# Patient Record
Sex: Male | Born: 1985 | Race: White | Hispanic: No | Marital: Married
Health system: Southern US, Community
[De-identification: ages and names within clinical notes are randomized; demographics above are authoritative.]

---

## 2010-04-24 ENCOUNTER — Emergency Department (HOSPITAL_COMMUNITY)
Admission: EM | Admit: 2010-04-24 | Discharge: 2010-04-24 | Payer: Self-pay | Source: Home / Self Care | Admitting: Emergency Medicine

## 2010-04-30 ENCOUNTER — Ambulatory Visit (HOSPITAL_COMMUNITY)
Admission: RE | Admit: 2010-04-30 | Discharge: 2010-04-30 | Payer: Self-pay | Source: Home / Self Care | Admitting: Urology

## 2010-11-12 LAB — CBC
HCT: 48 % (ref 39.0–52.0)
MCH: 31.2 pg (ref 26.0–34.0)
MCV: 95.3 fL (ref 78.0–100.0)
RDW: 12.7 % (ref 11.5–15.5)

## 2010-11-12 LAB — BASIC METABOLIC PANEL
BUN: 18 mg/dL (ref 6–23)
Chloride: 103 mEq/L (ref 96–112)
Creatinine, Ser: 1.03 mg/dL (ref 0.4–1.5)
Glucose, Bld: 116 mg/dL — ABNORMAL HIGH (ref 70–99)
Sodium: 139 mEq/L (ref 135–145)

## 2010-11-12 LAB — URINALYSIS, ROUTINE W REFLEX MICROSCOPIC
Bilirubin Urine: NEGATIVE
Nitrite: NEGATIVE
Specific Gravity, Urine: 1.015 (ref 1.005–1.030)
pH: 6.5 (ref 5.0–8.0)

## 2010-11-12 LAB — URINE MICROSCOPIC-ADD ON

## 2010-11-12 LAB — DIFFERENTIAL
Eosinophils Relative: 2 % (ref 0–5)
Lymphocytes Relative: 26 % (ref 12–46)
Lymphs Abs: 1.5 10*3/uL (ref 0.7–4.0)
Monocytes Absolute: 0.4 10*3/uL (ref 0.1–1.0)

## 2012-06-11 ENCOUNTER — Emergency Department: Payer: Self-pay | Admitting: Emergency Medicine
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
## 2021-05-12 ENCOUNTER — Emergency Department: Payer: Managed Care, Other (non HMO)

## 2021-05-12 ENCOUNTER — Emergency Department
Admission: EM | Admit: 2021-05-12 | Discharge: 2021-05-12 | Disposition: A | Discharge status: Home / Self Care | Payer: Managed Care, Other (non HMO) | Attending: Emergency Medicine | Admitting: Emergency Medicine

## 2021-05-12 ENCOUNTER — Other Ambulatory Visit: Payer: Self-pay

## 2021-05-12 DIAGNOSIS — S39012A Strain of muscle, fascia and tendon of lower back, initial encounter: Secondary | ICD-10-CM | POA: Diagnosis not present

## 2021-05-12 DIAGNOSIS — Y92410 Unspecified street and highway as the place of occurrence of the external cause: Secondary | ICD-10-CM | POA: Diagnosis not present

## 2021-05-12 DIAGNOSIS — S59911A Unspecified injury of right forearm, initial encounter: Secondary | ICD-10-CM | POA: Diagnosis present

## 2021-05-12 DIAGNOSIS — S51811A Laceration without foreign body of right forearm, initial encounter: Secondary | ICD-10-CM | POA: Diagnosis not present

## 2021-05-12 DIAGNOSIS — Z23 Encounter for immunization: Secondary | ICD-10-CM | POA: Diagnosis not present

## 2021-05-12 LAB — CBC WITH DIFFERENTIAL/PLATELET
Abs Immature Granulocytes: 0.01 10*3/uL (ref 0.00–0.07)
Basophils Absolute: 0 10*3/uL (ref 0.0–0.1)
Basophils Relative: 1 %
Eosinophils Absolute: 0.1 10*3/uL (ref 0.0–0.5)
Eosinophils Relative: 1 %
HCT: 45.1 % (ref 39.0–52.0)
Hemoglobin: 15.6 g/dL (ref 13.0–17.0)
Immature Granulocytes: 0 %
Lymphocytes Relative: 26 %
Lymphs Abs: 1.6 10*3/uL (ref 0.7–4.0)
MCH: 32.5 pg (ref 26.0–34.0)
MCHC: 34.6 g/dL (ref 30.0–36.0)
MCV: 94 fL (ref 80.0–100.0)
Monocytes Absolute: 0.4 10*3/uL (ref 0.1–1.0)
Monocytes Relative: 7 %
Neutro Abs: 3.9 10*3/uL (ref 1.7–7.7)
Neutrophils Relative %: 65 %
Platelets: 196 10*3/uL (ref 150–400)
RBC: 4.8 MIL/uL (ref 4.22–5.81)
RDW: 11.9 % (ref 11.5–15.5)
WBC: 6 10*3/uL (ref 4.0–10.5)
nRBC: 0 % (ref 0.0–0.2)

## 2021-05-12 LAB — COMPREHENSIVE METABOLIC PANEL
ALT: 21 U/L (ref 0–44)
AST: 25 U/L (ref 15–41)
Albumin: 4.3 g/dL (ref 3.5–5.0)
Alkaline Phosphatase: 46 U/L (ref 38–126)
Anion gap: 6 (ref 5–15)
BUN: 20 mg/dL (ref 6–20)
CO2: 29 mmol/L (ref 22–32)
Calcium: 9 mg/dL (ref 8.9–10.3)
Chloride: 104 mmol/L (ref 98–111)
Creatinine, Ser: 0.78 mg/dL (ref 0.61–1.24)
GFR, Estimated: >60 mL/min (ref >60)
Glucose, Bld: 109 mg/dL — ABNORMAL HIGH (ref 70–99)
Potassium: 4.3 mmol/L (ref 3.5–5.1)
Sodium: 139 mmol/L (ref 135–145)
Total Bilirubin: 0.8 mg/dL (ref 0.3–1.2)
Total Protein: 6.9 g/dL (ref 6.5–8.1)

## 2021-05-12 MED ORDER — TETANUS-DIPHTH-ACELL PERTUSSIS 5-2.5-18.5 LF-MCG/0.5 IM SUSY
0.5000 mL | PREFILLED_SYRINGE | Freq: Once | INTRAMUSCULAR | Status: AC
  Administered 2021-05-12: 0.5 mL via INTRAMUSCULAR
  Filled 2021-05-12: qty 0.5

## 2021-05-12 MED ORDER — IOHEXOL 350 MG/ML SOLN
80.0000 mL | Freq: Once | INTRAVENOUS | Status: AC | PRN
  Administered 2021-05-12: 80 mL via INTRAVENOUS

## 2021-05-12 MED ORDER — ONDANSETRON HCL 4 MG/2ML IJ SOLN
4.0000 mg | Freq: Once | INTRAMUSCULAR | Status: AC
  Administered 2021-05-12: 4 mg via INTRAVENOUS
  Filled 2021-05-12: qty 2

## 2021-05-12 MED ORDER — MORPHINE SULFATE (PF) 4 MG/ML IV SOLN
4.0000 mg | Freq: Once | INTRAVENOUS | Status: AC
  Administered 2021-05-12: 4 mg via INTRAVENOUS
  Filled 2021-05-12: qty 1

## 2021-05-12 MED ORDER — LIDOCAINE HCL (PF) 1 % IJ SOLN
INTRAMUSCULAR | Status: AC
  Filled 2021-05-12: qty 5
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
## 2021-05-12 NOTE — Discharge Instructions (Addendum)
Keep the wounds clean and dry.  You can shower but do not submerge the wounds in standing water.  Pat them dry afterwards.  Please return for any signs of infection like increasing pain redness or swelling.  Have the stitches out in 7 to 10 days.  You can get them out at your doctor's office or here.  I would call your doctor and make sure they have the equipment to take them out. Use ice or heat to your back.  About 20 minutes every hour or 2.  Be careful not to fall asleep on either because you can get burns or frostbite.  You can use Motrin for the over-the-counter pills 3 times a day with food for for 5 days.  That should help.  Would avoid any heavy lifting or straining for the next few days.  I will give you a work note.  Follow-up with your doctor.  If you are not better after a week or so physical therapy may help.
